# Patient Record
Sex: Female | Born: 1980 | Hispanic: Yes | Marital: Single | Smoking: Never smoker
Health system: Southern US, Community
[De-identification: ages and names within clinical notes are randomized; demographics above are authoritative.]

---

## 2013-04-09 ENCOUNTER — Encounter (HOSPITAL_COMMUNITY): Payer: Self-pay | Admitting: Emergency Medicine

## 2013-04-09 ENCOUNTER — Emergency Department (HOSPITAL_COMMUNITY): Payer: Self-pay

## 2013-04-09 ENCOUNTER — Emergency Department (HOSPITAL_COMMUNITY)
Admission: EM | Admit: 2013-04-09 | Discharge: 2013-04-09 | Disposition: A | Discharge status: Home / Self Care | Payer: Self-pay | Attending: Emergency Medicine | Admitting: Emergency Medicine

## 2013-04-09 DIAGNOSIS — J069 Acute upper respiratory infection, unspecified: Secondary | ICD-10-CM | POA: Insufficient documentation

## 2013-04-09 DIAGNOSIS — Z3202 Encounter for pregnancy test, result negative: Secondary | ICD-10-CM | POA: Insufficient documentation

## 2013-04-09 LAB — POCT PREGNANCY, URINE: Preg Test, Ur: NEGATIVE

## 2013-04-09 MED ORDER — ACETAMINOPHEN-CODEINE #3 300-30 MG PO TABS: 1.0000 | ORAL_TABLET | Freq: Four times a day (QID) | ORAL | Status: AC | PRN

## 2013-04-09 MED ORDER — GUAIFENESIN 100 MG/5ML PO LIQD: 100.0000 mg | ORAL | Status: AC | PRN

## 2013-04-09 NOTE — ED Notes (Signed)
Pt is here with cough for last week and hard to sleep.  No fever but reports yellow sputum.

## 2013-04-09 NOTE — ED Provider Notes (Signed)
CSN: [id number]     Arrival date & time 04/09/13  0746 History  This chart was scribed for non-physician practitioner, Junious Silk, PA-C working with Candyce Churn, MD by Greggory Stallion, ED scribe. This patient was seen in room TR07C/TR07C and the patient's care was started at 9:18 AM.   Chief Complaint  Patient presents with  . Cough   The history is provided by the patient. No language interpreter was used.   HPI Comments: Isabella Price is a 33 y.o. female who presents to the Emergency Department complaining of worsening productive cough of yellow sputum that started 4 days ago. The cough is not worse at night. It is bad all the time. Pt has not taken any medications for the cough. Denies fever, rhinorrhea, sore throat, congestion. Denies history of lung disease. Denies history of smoking cigarettes.   History reviewed. No pertinent past medical history. History reviewed. No pertinent past surgical history. No family history on file. History  Substance Use Topics  . Smoking status: Never Smoker   . Smokeless tobacco: Not on file  . Alcohol Use: No   OB History   Grav Para Term Preterm Abortions TAB SAB Ect Mult Living                 Review of Systems  Constitutional: Negative for fever.  HENT: Negative for congestion, rhinorrhea and sore throat.   Respiratory: Positive for cough.   All other systems reviewed and are negative.    Allergies  Review of patient's allergies indicates no known allergies.  Home Medications  No current outpatient prescriptions on file. BP 125/88  Pulse 99  Temp(Src) 97.9 F (36.6 C) (Oral)  Resp 18  SpO2 99%  LMP 03/17/2013  Physical Exam  Nursing note and vitals reviewed. Constitutional: She is oriented to person, place, and time. She appears well-developed and well-nourished. No distress.  HENT:  Head: Normocephalic and atraumatic.  Right Ear: Tympanic membrane, external ear and ear canal normal.  Left Ear: Tympanic  membrane, external ear and ear canal normal.  Nose: Nose normal. Right sinus exhibits no maxillary sinus tenderness and no frontal sinus tenderness. Left sinus exhibits no maxillary sinus tenderness and no frontal sinus tenderness.  Mouth/Throat: Uvula is midline, oropharynx is clear and moist and mucous membranes are normal. No trismus in the jaw.  Eyes: Conjunctivae are normal.  Neck: Normal range of motion.  Cardiovascular: Normal rate, regular rhythm and normal heart sounds.   Pulmonary/Chest: Effort normal and breath sounds normal. No accessory muscle usage or stridor. No respiratory distress. She has no decreased breath sounds. She has no wheezes. She has no rales.  Dry cough heard on exam.   Abdominal: Soft. She exhibits no distension.  Musculoskeletal: Normal range of motion.  Neurological: She is alert and oriented to person, place, and time. She has normal strength.  Skin: Skin is warm and dry. She is not diaphoretic. No erythema.  Psychiatric: She has a normal mood and affect. Her behavior is normal.    ED Course  Procedures (including critical care time)  DIAGNOSTIC STUDIES: Oxygen Saturation is 99% on RA, normal by my interpretation.    COORDINATION OF CARE: 9:21 AM-Discussed treatment plan which includes cough medicine and an expectorant with pt at bedside and pt agreed to plan.   Labs Review Labs Reviewed  POCT PREGNANCY, URINE   Imaging Review Dg Chest 2 View  04/09/2013   CLINICAL DATA:  Cough.  EXAM: CHEST  2 VIEW  COMPARISON:  None.  FINDINGS: Lung volumes are low but the lungs are clear. No pneumothorax or pleural effusion. Heart size is normal. No focal bony abnormality.  IMPRESSION: Negative chest.   Electronically Signed   By: Drusilla Kannerhomas  Dalessio M.D.   On: 04/09/2013 08:26    EKG Interpretation   None       MDM   1. URI (upper respiratory infection)    Pt CXR negative for acute infiltrate. Patients symptoms are consistent with URI, likely viral etiology.  Discussed that antibiotics are not indicated for viral infections. Pt will be discharged with symptomatic treatment.  Verbalizes understanding and is agreeable with plan. Pt is hemodynamically stable & in NAD prior to dc.   I personally performed the services described in this documentation, which was scribed in my presence. The recorded information has been reviewed and is accurate.    Mora BellmanHannah S Linkyn Gobin, PA-C 04/09/13 [phone]

## 2013-04-09 NOTE — Discharge Instructions (Signed)
Tos - Adultos  (Cough, Adult)  La tos es un reflejo. Ayuda a limpiar la garganta y las vas areas. Ayuda a que el organismo se cure. La tos puede durar entre 2  3 semanas (aguda) o puede durar ms de 8 semanas (crnica) Las causas ms frecuentes son las infecciones, las alergias o los resfros.  CUIDADOS EN EL HOGAR   Slo tome la medicacin segn las indicaciones.  Si le indican medicamentos (antibiticos), tmelos tal como se le indic. Tmelos todos, aunque se sienta mejor.  Coloque un vaporizador o humidificador de niebla fra en su casa. Esto puede ayudar a Runner, broadcasting/film/video (secreciones).  Duerma de modo que quede casi sentado (semi erguido). Use almohadas para lograr esta posicin. Esto ayuda a disminuir la tos.  Descanse todo lo que pueda.  Si fuma, abandone el hbito. SOLICITE AYUDA DE INMEDIATO SI:   Observa una secrecin de color blanco amarillento (pus) en el moco.  La tos empeora.  El medicamento no disminuye la tos y no puede dormir.  Tose y escupe sangre.  Tiene dificultad para respirar.  Siente un dolor ms intenso y los medicamentos no [employer].  Tiene fiebre. ASEGRESE DE QUE:   Comprende estas instrucciones.  Controlar su enfermedad.  Solicitar ayuda de inmediato si no mejora o si empeora. Document Released: 12/01/2010 Document Revised: 06/12/2011 [employer] Patient Information 2014 [place], [place].  Infeccin de las vas areas superiores en los adultos (Upper Respiratory Infection, Adult)  La infeccin de las vas areas superiores tambin se conoce como resfro comn. La causa es un tipo de germen (virus). Los resfros se diseminan fcilmente (son contagiosos). Puede transmitirlo a los dems al besar, toser, estornudar o beber en el mismo vaso. Generalmente se cura en 1 a 2 semanas.  CUIDADOS EN EL HOGAR   Tome la medicacin segn las indicaciones.  Use un humidificador caliente o respire el vapor en una ducha caliente.  Beba gran  cantidad de lquido para mantener la orina de tono claro o color amarillo plido.  Debe hacer reposo.  Regrese a su trabajo cuando la temperatura se haya normalizado, o cuando el mdico se lo indique. Puede usar un barbijo y Customer service manager las manos para evitar que el resfro se contagie. SOLICITE AYUDA DE INMEDIATO SI:   Luego de los primeros das siente que empeora en vez de [place].  Tiene dudas relacionadas con los medicamentos.  Siente escalofros, le falta el aire o escupe moco de color marrn o rojo.  Tiene una secrecin nasal de color amarillo o marrn, o siente dolor en el rostro, especialmente cuando se inclina hacia adelante.  Tiene fiebre, siente el cuello hinchado, tiene dolor al tragar u observa manchas blancas en el fondo de la garganta.  Comienza a sentir Herbalist de cabeza intenso o persistente, dolor de odos, en el seno nasal o en el pecho.  Al respirar emite un sonido similar a un silbido (sibilancias).  Siente falta de aire o escupe sangre al toser.  Tiene dolores musculares o rigidez en el cuello. ASEGRESE DE QUE:   Comprende estas instrucciones.  Controlar su enfermedad.  Solicitar ayuda de inmediato si no mejora o si empeora. Document Released: 08/22/2010 Document Revised: 06/12/2011 [facility] Patient Information 2014 [place], [place].   Emergency Department Resource Guide 1) Find a Doctor and Pay Out of Pocket Although you won't have to find out who is covered by your insurance plan, it is a good idea to ask around and get recommendations. You will then need  to call the office and see if the doctor you have chosen will accept you as a new patient and what types of options they offer for patients who are self-pay. Some doctors offer discounts or will set up payment plans for their patients who do not have insurance, but you will need to ask so you aren't surprised when you get to your appointment.  2) Contact Your Local Health Department Not all health  departments have doctors that can see patients for sick visits, but many do, so it is worth a call to see if yours does. If you don't know where your local health department is, you can check in your phone book. The CDC also has a tool to help you locate your state's health department, and many state websites also have listings of all of their local health departments.  3) Find a [facility] If your illness is not likely to be very severe or complicated, you may want to try a walk in clinic. These are popping up all over the country in pharmacies, drugstores, and shopping centers. They're usually staffed by nurse practitioners or physician assistants that have been trained to treat common illnesses and complaints. They're usually fairly quick and inexpensive. However, if you have serious medical issues or chronic medical problems, these are probably not your best option.  No Primary Care Doctor: - Call Health Connect at  [phone] - they can help you locate a primary care doctor that  accepts your insurance, provides certain services, etc. - Physician Referral Service- [phone]  Chronic Pain Problems: Organization         Address  Phone   Notes  [facility]  [phone] Patients need to be referred by their primary care doctor.   Medication Assistance: Organization         Address  Phone   Notes  [facility] Medication [facility] [address] [place], [place] [address] [phone] --Must be a resident of [facility] -- Must have NO insurance coverage whatsoever (no Medicaid/ Medicare, etc.) -- The pt. MUST have a primary care doctor that directs their care regularly and follows them in the community   MedAssist  [phone]   [employer]  [phone]    Agencies that provide inexpensive medical care: Organization         Address  Phone   Notes  [place]  [phone]   [facility]     [phone]   [facility] [place], [place] [address] [phone]   [place] [address] [place] [phone]   Planned Parenthood    [phone]   [facility]    [phone]   [place] [facility] [address], [place] Phone:  [phone], Fax:  [phone] Hours of Operation:  9 am - 6 pm, M-F.  Also accepts Medicaid/Medicare and self-pay.  [facility] for [place], [address], [place] Phone: [phone], Fax: [phone]. Hours of Operation:  8:30 am - 5:30 pm, M-F.  Also accepts Medicaid and self-pay.  [facility] [place] [address] [place] Phone: [phone]   [place], [place], [place] [phone], Ext. [phone] Mondays & Thursdays: 7-9 AM.  First 15 patients are seen on a first come, first serve basis.  Medicaid-accepting [facility] Providers:  Organization         Address  Phone   Notes  [facility] [address], [place] [phone] Also accepts self-pay patients.  [facility] [address] [place], [place]  [phone]   [facility] [address], [place] [phone]   [facility] [address], [place] [phone]   Renaye Rakers [address], [place]   [phone] Only accepts [place] patients after they have their name applied to their card.   Self-Pay (no insurance) in [facility]:  Organization         Address  Phone   Notes  Sickle Cell Patients, [facility] [address] [place], [place] [phone]   [facility] [address] [place], [place] [phone]   [facility] [place]  [address], [place] [phone]     [facility]/Dr. Osei-Bonsu  [address], [place] or [address], [place] [phone] Phone number for both [place] and [place] locations is the same.  Urgent Medical and [facility] [address], [place] [phone]   [facility] [address] [place] or [address] [phone]   [facility] [address], [place] [phone], phone; [phone], fax Sees patients 1st and 3rd Saturday of every month.  Must not qualify for public or private insurance (i.e. Medicaid, Medicare, [place] Health Choice, Veterans' Benefits)  Household income should be no more than 200% of the poverty level The clinic cannot treat you if you are pregnant or think you are pregnant  Sexually transmitted diseases are not treated at the clinic.    Dental Care: Organization         Address  Phone  Notes  [facility] Department of [facility] [address] [place], [place] [phone] Accepts children up to age 46 who are enrolled in [place] or [place] Health Choice; pregnant women with a Medicaid card; and children who have applied for Medicaid or [place] Health Choice, but were declined, whose parents can pay a reduced fee at time of service.  [facility] Department of [facility]  [address], [place] [phone] Accepts children up to age 32 who are enrolled in [place] or [place] Health Choice; pregnant women with a Medicaid card; and children who have applied for Medicaid or [place] Health Choice, but were declined, whose parents can pay a reduced fee at time of service.  [facility]  [address] [place], [place] [phone] Patients are seen by appointment only. Walk-ins are not accepted. [facility] will see patients 24 years of age and older. Monday - Tuesday (8am-5pm) Most Wednesdays (8:30-5pm) $30 per visit,  cash only  [facility]  [address], [facility] [phone] Patients are seen by appointment only. Walk-ins are not accepted. [facility] will see patients 56 years of age and older. One Wednesday Evening (Monthly: Volunteer Based).  $30 per visit, cash only  [employer]  [phone] for adults; Children under age 79, call Graduate Pediatric Dentistry at [phone]. Children aged 57-14, please call [phone] to request  a pediatric application.  Dental services are provided in all areas of dental care including fillings, crowns and bridges, complete and partial dentures, implants, gum treatment, root canals, and extractions. Preventive care is also provided. Treatment is provided to both adults and children. Patients are selected via a lottery and there is often a waiting list.   [facility] [address], [place]  [phone] [email or web address]   [facility] [address], [place], [place] [phone], Ext. [phone] Second and Fourth Thursday of each month, opens at 6:30 AM; Clinic ends at 9 AM.  Patients are seen on a first-come first-served basis, and a limited number are seen during each clinic.   [facility]  [address], [place], [place] [phone]   Eligibility Requirements You must have lived in [place], [place], or [place] counties for at least the last three months.   You cannot be eligible for state or federal sponsored National Cityhealthcare insurance, including CIGNAVeterans Administration, [place]icaid, or [employer]Medicare.   You generally cannot be eligible for healthcare insurance through your employer.    How to apply: Eligibility screenings are held every Tuesday and Wednesday afternoon from 1:00 pm until 4:00 pm. You do not need an appointment for the interview!  [facility] [address], [place], [place] [phone]   [facility]   [phone]   [facility]  [phone]   [facility]  [phone]    Behavioral Health Resources in the Community: Intensive Outpatient Programs Organization         Address  Phone  Notes  [facility] [address], [place], [place] [phone]   [facility] Outpatient [address], [place], [place] [phone]   [facility] [address], [place], [place]  [phone]   [facility] [address],  [place], [place] [phone] or [phone]   Substance Abuse Resources Organization         Address  Phone  Notes  [facility]  [phone]   [facility]  [phone]   [facility]  [phone]   [facility]  [phone]   Residential & Outpatient Substance Abuse Program  [phone]   Psychological Services Organization         Address  Phone  Notes  [facility]  [phone]- [phone]   [facility]  [phone]- [phone]   [facility] [address], [place] [phone] or [phone]    Mobile Crisis Teams Organization         Address  Phone  Notes  [facility], Mobile Crisis Care Unit  [phone]   [facility]  [address] [place], [place] [phone]   Doristine LocksSharon DeEsch [address] [place] [phone]    Self-Help/Support Groups Organization         Address  Phone             Notes  [facility] of [place] - variety of support groups  336- [phone] Call for more information  Narcotics Anonymous (NA), [facility] [address], [place]  2 meetings at this location   Chief Executive Officeresidential Treatment Programs Organization         Address  Phone  Notes  [facility] [address],    [place]  [phone]   [facility]  [address], [place]  [phone], [place], [place] [phone]    [facility] [address], [place] [phone] Admissions: 8am-3pm M-F  [facility] [address]    [place], [place] [phone]   [facility] [address], [place], [place] [phone]   [facility] [address]  [place], [place] [phone]   [facility] - Intensive Outpatient [address], [place], [place] [phone]   [facility] ([facility] [address]  [place], [place] [phone] or [phone]   [facility] ([facility]) [address] [place], [place] [phone] Accepts Medicaid  [facility] [address]  [place] [phone] Substance Abuse/Addiction Treatment   [facility] Resources Organization         Address  Phone  Notes  [facility]  [phone]   Angie FavaJulie Brannon, PhD [address], [address] [place], [place]   [phone] or [phone]   [facility]   [address] [place], [place] [phone]   [facility] [address], [place], [place] [phone] Insurance/Medicaid/sponsorship through [facility]  [facility] [address]                                    [place], [place] [phone]) [phone] Therapy/tele-psych/case  [facility] [address]   [place], [place] [phone]    Dr. Lolly MustacheArfeen  [phone]   [facility]  [employer] [facility] 1) [address], [place] 2) [address], [place] 3)  [address], [place] [phone](336) [phone]  [phone]   [facility] Child Abuse Hotline [phone] or [phone](336) [phone] (After Hours)

## 2013-04-10 NOTE — ED Provider Notes (Signed)
Medical screening examination/treatment/procedure(s) were performed by non-physician practitioner and as supervising physician I was immediately available for consultation/collaboration.  EKG Interpretation   None         Candyce ChurnJohn David Oluwatobi Visser, MD 04/10/13 1446

## 2014-09-29 IMAGING — CR DG CHEST 2V
2 series · 2 of 2 positions shown · non-contrast
Comparison: None.

CLINICAL DATA: Cough.

EXAM:
CHEST  2 VIEW

[w chest pa]
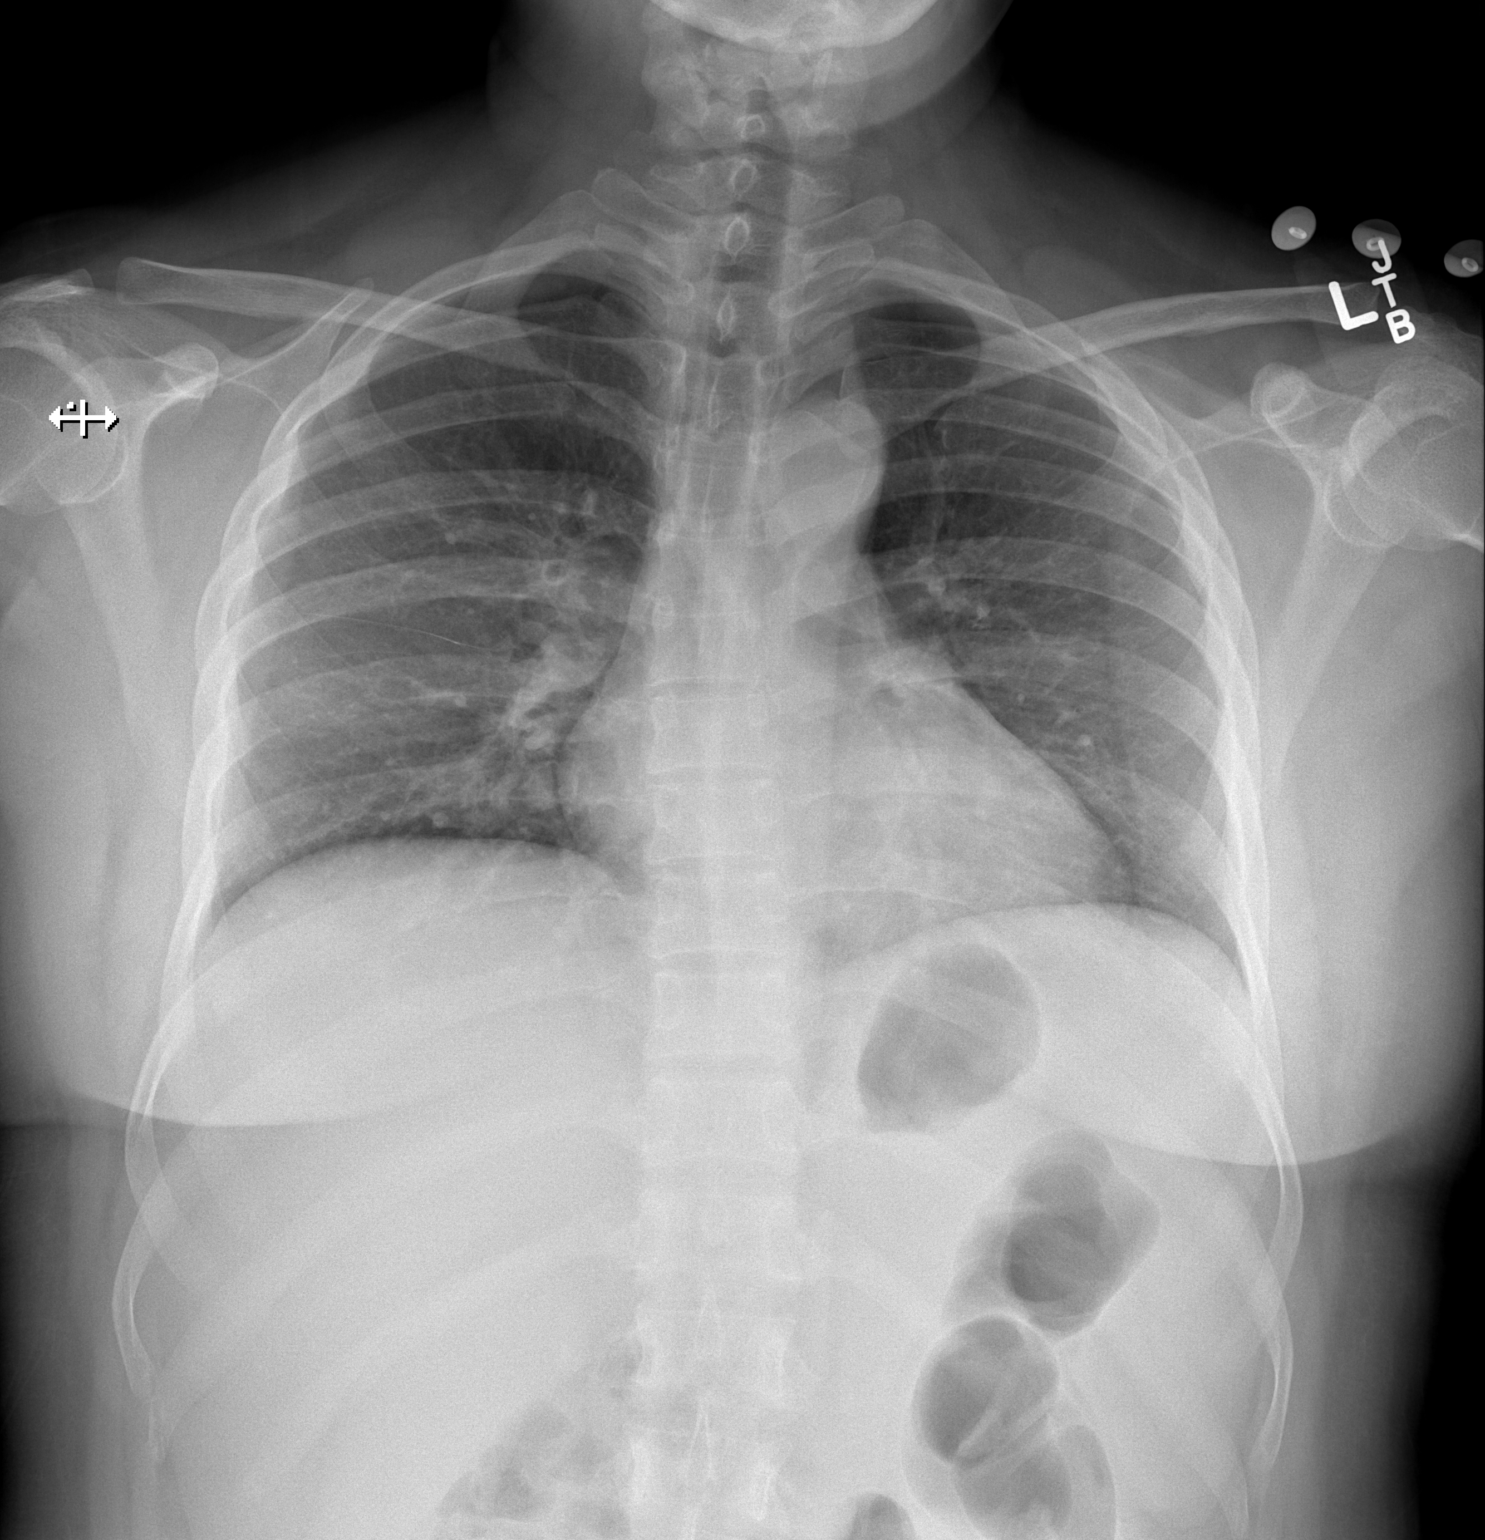

[w chest lat]
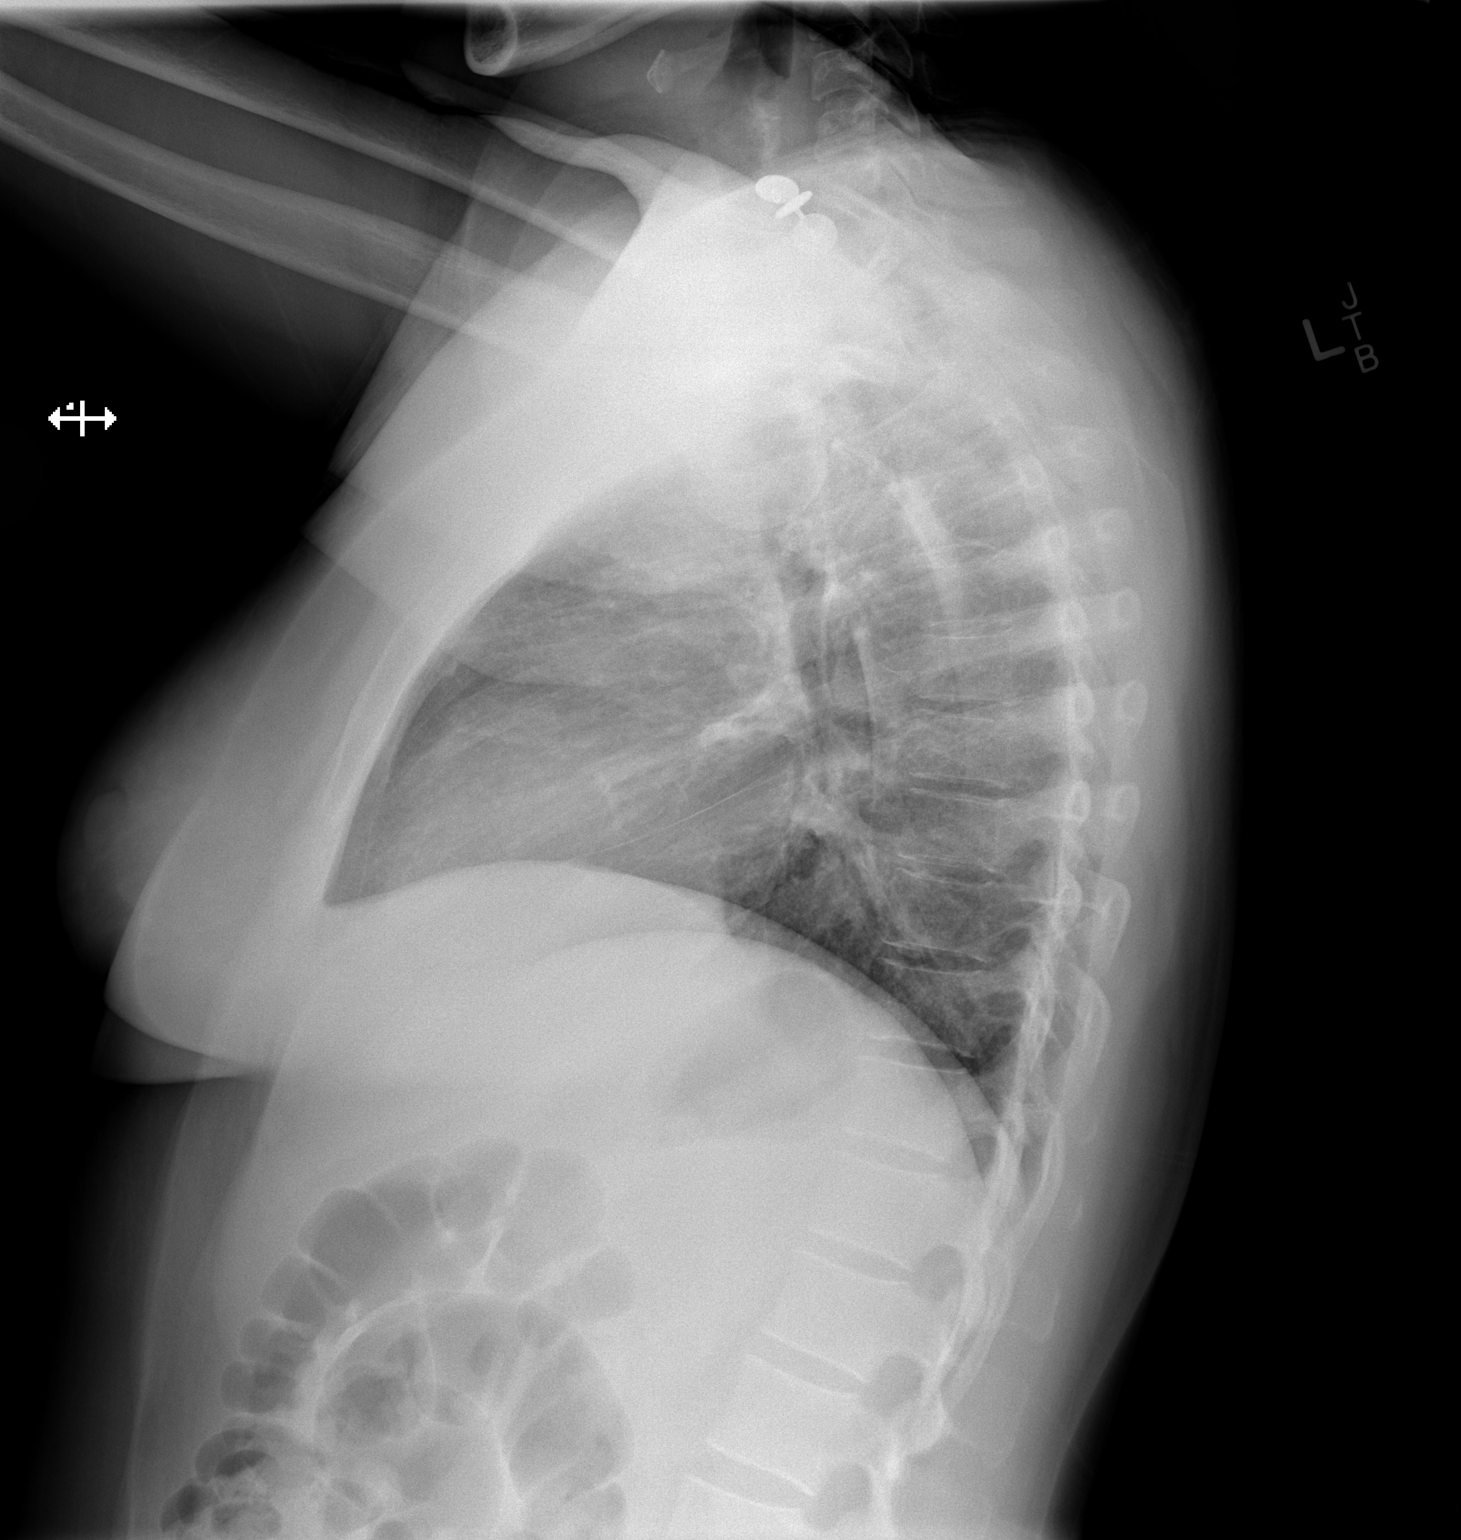

[2 of 2 positions shown; findings below may reference images not displayed]

FINDINGS: Lung volumes are low but the lungs are clear. No pneumothorax or
pleural effusion. Heart size is normal. No focal bony abnormality.
IMPRESSION: Negative chest.
